# Patient Record
Sex: Female | Born: 1970 | Race: White | Hispanic: No | Marital: Married | Smoking: Never smoker
Health system: Southern US, Community
[De-identification: ages and names within clinical notes are randomized; demographics above are authoritative.]

## PROBLEM LIST (undated history)

## (undated) DIAGNOSIS — E785 Hyperlipidemia, unspecified: Secondary | ICD-10-CM

## (undated) DIAGNOSIS — F419 Anxiety disorder, unspecified: Secondary | ICD-10-CM

## (undated) DIAGNOSIS — K589 Irritable bowel syndrome without diarrhea: Secondary | ICD-10-CM

## (undated) DIAGNOSIS — E039 Hypothyroidism, unspecified: Secondary | ICD-10-CM

## (undated) DIAGNOSIS — R51 Headache: Secondary | ICD-10-CM

## (undated) DIAGNOSIS — D229 Melanocytic nevi, unspecified: Secondary | ICD-10-CM

## (undated) DIAGNOSIS — G8929 Other chronic pain: Secondary | ICD-10-CM

## (undated) DIAGNOSIS — K52839 Microscopic colitis, unspecified: Secondary | ICD-10-CM

## (undated) DIAGNOSIS — M797 Fibromyalgia: Secondary | ICD-10-CM

## (undated) HISTORY — DX: Headache: R51

## (undated) HISTORY — PX: NO PAST SURGERIES: SHX2092

## (undated) HISTORY — DX: Hyperlipidemia, unspecified: E78.5

## (undated) HISTORY — DX: Fibromyalgia: M79.7

## (undated) HISTORY — DX: Irritable bowel syndrome, unspecified: K58.9

## (undated) HISTORY — DX: Melanocytic nevi, unspecified: D22.9

## (undated) HISTORY — PX: LASER ABLATION OF THE CERVIX: SHX1949

## (undated) HISTORY — DX: Other chronic pain: G89.29

## (undated) HISTORY — DX: Microscopic colitis, unspecified: K52.839

## (undated) HISTORY — DX: Anxiety disorder, unspecified: F41.9

## (undated) HISTORY — DX: Hypothyroidism, unspecified: E03.9

---

## 2010-02-17 DIAGNOSIS — D229 Melanocytic nevi, unspecified: Secondary | ICD-10-CM

## 2010-02-17 HISTORY — DX: Melanocytic nevi, unspecified: D22.9

## 2013-06-07 DIAGNOSIS — D229 Melanocytic nevi, unspecified: Secondary | ICD-10-CM

## 2013-06-07 HISTORY — DX: Melanocytic nevi, unspecified: D22.9

## 2016-06-08 ENCOUNTER — Encounter: Payer: Self-pay | Admitting: Gastroenterology

## 2016-07-13 ENCOUNTER — Encounter: Payer: Self-pay | Admitting: Gastroenterology

## 2016-07-13 ENCOUNTER — Ambulatory Visit (INDEPENDENT_AMBULATORY_CARE_PROVIDER_SITE_OTHER): Payer: BLUE CROSS/BLUE SHIELD | Admitting: Gastroenterology

## 2016-07-13 VITALS — BP 104/76 | HR 64 | Ht 65.5 in | Wt 156.1 lb

## 2016-07-13 DIAGNOSIS — K649 Unspecified hemorrhoids: Secondary | ICD-10-CM | POA: Diagnosis not present

## 2016-07-13 DIAGNOSIS — R140 Abdominal distension (gaseous): Secondary | ICD-10-CM

## 2016-07-13 DIAGNOSIS — K589 Irritable bowel syndrome without diarrhea: Secondary | ICD-10-CM

## 2016-07-13 MED ORDER — DICYCLOMINE HCL 10 MG PO CAPS: 10.0000 mg | ORAL_CAPSULE | Freq: Three times a day (TID) | ORAL | 3 refills | Status: AC | PRN

## 2016-07-13 NOTE — Progress Notes (Signed)
HPI :  46 y/o female with a reported history of IBS, fibromyalgia, anxiety, here for a new patient evaluatoin for gas / bloating / hemorrhoids / constipation.   She reports ongoing symptoms of IBS for a long time, several years.   Bowel habits variable. She previously had loose stools for a period of time, and then may not have a bowel movement for up to a week. Constipation in the past month has been much more prominent, not having any diarrhea recently. She has a lot of straining to relieve herself. Hemorrhoids present, no blood in the stool, only when wiping. Hemorrhoids are paiful, she thinks prolapse and she thinks she reduce without intervention. She has been straining more often recently.  She has some discomfort and bloating throughout her entire abdomen, often in her lower abdomen and sometimes in hier mid / left abdomen with spasm. She has some relief of this with a bowel movements.  No nausea or vomiting. Eating okay. She has a lot of gas production and inability to control her flatulence at times, hard time holding it in. No weight loss.   Great aunt with colon cancer, no first degree relatives with CRC. She has had a prior colonoscopy, maybe around 2014, she thinks normal but mentions she may have had "microscopic colitis". Denies any specific treatment for this.   She takes ibuprofen, she takes it twice a day for fibromyalgia.   Not taking anything else for her bowels at present. She thinks she has had testing for celiac disease before and negative.     Past Medical History:  Diagnosis Date  . Anxiety   . Chronic headaches   . Fibromyalgia   . HLD (hyperlipidemia)   . Hypothyroidism   . IBS (irritable bowel syndrome)      Past Surgical History:  Procedure Laterality Date  . NO PAST SURGERIES     Family History  Problem Relation Age of Onset  . Colon polyps Mother   . Irritable bowel syndrome Mother   . Lung cancer Father        smoker  . Colon polyps Maternal  Grandmother    Social History  Substance Use Topics  . Smoking status: Never Smoker  . Smokeless tobacco: Never Used  . Alcohol use Yes     Comment: social   Current Outpatient Prescriptions  Medication Sig Dispense Refill  . escitalopram (LEXAPRO) 20 MG tablet Take 20 mg by mouth daily.  3  . levothyroxine (SYNTHROID, LEVOTHROID) 100 MCG tablet Take 100 mcg by mouth daily.  3  . LORazepam (ATIVAN) 1 MG tablet Take 1 mg by mouth 4 (four) times daily as needed.  5  . LYRICA 300 MG capsule Take 300 mg by mouth 2 (two) times daily.  5   No current facility-administered medications for this visit.    Allergies  Allergen Reactions  . Codeine Nausea And Vomiting     Review of Systems: All systems reviewed and negative except where noted in HPI.   No results found for: WBC, HGB, HCT, MCV, PLT  No results found for: CREATININE, BUN, NA, K, CL, CO2  No results found for: ALT, AST, GGT, ALKPHOS, BILITOT   Physical Exam: BP 104/76 (BP Location: Left Arm, Patient Position: Sitting, Cuff Size: Normal)   Pulse 64   Ht 5' 5.5" (1.664 m) Comment: height measured without shoes  Wt 156 lb 2 oz (70.8 kg)   LMP 06/30/2016   BMI 25.59 kg/m  Constitutional: Pleasant,well-developed, female  in no acute distress. HEENT: Normocephalic and atraumatic. Conjunctivae are normal. No scleral icterus. Neck supple.  Cardiovascular: Normal rate, regular rhythm.  Pulmonary/chest: Effort normal and breath sounds normal. No wheezing, rales or rhonchi. Abdominal: Soft, nondistended, mild diffuse discomfort with mild palpation, no focal findings.. There are no masses palpable. No hepatomegaly. DRE / Anoscopy - no mass lesion, normal resting tone, decreased squeeze tone / decent, small internal hemorrhoids Extremities: no edema Lymphadenopathy: No cervical adenopathy noted. Neurological: Alert and oriented to person place and time. Skin: Skin is warm and dry. No rashes noted. Psychiatric: Normal mood and  affect. Behavior is normal.   ASSESSMENT AND PLAN: 46 year old female with history as outlined above, constellation of symptoms are most consistent with IBS. I've asked to get records from her prior colonoscopy to ensure normal, she reports findings of "microscopic colitis", although denies any specific treatment for this, and uses NSAIDs routinely which can confound this presentation. Given her severe constipation of present doubt she has microscopic colitis. Otherwise we'll obtain prior labs that she has done, specifically make sure no evidence of celiac disease. Of note she has some decreased squeeze pressure anal sphincter on exam today in light of her symptoms of inability to control gas.  This time recommend following: - trial of low FODMAP diet - trial of Bentyl 10mg  q 8 hrs prn  - trial of miralax 17gm BID, titrate as needed for constipation - stop ibuprofen and all NSAIDs, use tylenol PRN - get results from prior colonoscopy, and celiac testing / basic labs - asked to call back in 1 month if no better - will consider pelvic floor PT if symptoms persist despite management of bloating / gas  - may consider trial of VSL#3 or rifaximin pending course - treat constipation to treat hemorrhoids, if symptoms continue to bother her can consider banding  All questions answered, she agreed with the plan.   [place] Cellar, MD [facility] Pager [phone]

## 2016-07-13 NOTE — Patient Instructions (Signed)
If you are age 46 or older, your body mass index should be between 23-30. Your Body mass index is 25.59 kg/m. If this is out of the aforementioned range listed, please consider follow up with your Primary Care Provider.  If you are age 97 or younger, your body mass index should be between 19-25. Your Body mass index is 25.59 kg/m. If this is out of the aformentioned range listed, please consider follow up with your Primary Care Provider.   We have sent the following medications to your pharmacy for you to pick up at your convenience:  Bentyl  Please use Miralax twice daily.  Please discontinue Ibuprofen and use Tylenol instead.  You have been given a Low FodMap Diet to follow.  Please call back in 1 month if you have not improved.  Thank you.

## 2016-07-16 ENCOUNTER — Telehealth: Payer: Self-pay | Admitting: Gastroenterology

## 2016-07-16 NOTE — Telephone Encounter (Signed)
Received records of prior workup:  Colonoscopy 03/29/2009 - normal colon but biopsies were positive for microscopic colitis  Celiac serologies obtained on 03/11/2009 were NEGATIVE including normal IgA level She also had negative genetic testing for celiac on 03/11/2009  Will await her course following recommendations during recent clinic visit. I asked her to call me in a month at that time for reassessment.

## 2017-07-12 ENCOUNTER — Telehealth: Payer: Self-pay | Admitting: Gastroenterology

## 2017-07-12 ENCOUNTER — Other Ambulatory Visit: Payer: Self-pay

## 2017-07-12 DIAGNOSIS — R197 Diarrhea, unspecified: Secondary | ICD-10-CM

## 2017-07-12 NOTE — Telephone Encounter (Signed)
Patient states she has been having diarrhea for the past two weeks and wants advice on what to do.

## 2017-07-12 NOTE — Telephone Encounter (Signed)
The last time I saw her was about a year ago, she complained of constipation at that time and was given Miralax. If she is still taking Miralax she should stop it. She does have a history of microscopic colitis on remote biopsies, this is possible but if she has had an acute diarrhea for 2 weeks recommend a GI pathogen panel to ensure we rule out infectious etiologies. She should stop any Miralax she is taking and can use some immodium PRN in the interim. I will let her know what to do once stool studies return if symptoms persist despite immodium. thanks

## 2017-07-12 NOTE — Telephone Encounter (Signed)
Spoke to patient, she has had diarrhea for last several weeks, associated right after eating. She has bloating and some abdominal cramping but denies any severe abdominal pain, blood in stool or fever. She has not started any new medication or been on antibiotics. Last seen 07/13/16, midweek there are available APP appointments.

## 2017-07-12 NOTE — Telephone Encounter (Signed)
Patient advised to come do GI pathogen study, imodium prn and will let her know results as soon as they are back. She did stop taking the miralax on her own.

## 2017-07-13 ENCOUNTER — Other Ambulatory Visit: Payer: BLUE CROSS/BLUE SHIELD

## 2017-08-03 ENCOUNTER — Telehealth: Payer: Self-pay | Admitting: Gastroenterology

## 2017-08-03 NOTE — Telephone Encounter (Signed)
Spoke to pt. She has tried the imodium and it does help to slow it down for a couple of days but then back to normal. She understands -  Thought it was flagyl she had in the past for her condition. Sorry if that is wrong. She will work on getting the GI Path panel sample collected and submitted and will touch base with [place] in a couple of days. She thanks you for your help.

## 2017-08-03 NOTE — Telephone Encounter (Signed)
Has she tried taking immodium yet? I'm not sure what we are treating if we give her flagyl empirically, as she has a history remotely of microscopic colitis, which would not be treated with flagyl. I would prefer if she submitted the stool test first and use immodium to help slow her down. If immodium is not helping let me know and we can consider other therapy for microscopic colitis if she thinks that is what's driving this. Thanks

## 2017-08-03 NOTE — Telephone Encounter (Signed)
Called and spoke to pt. She is having a hard time getting the timing right with working and her bouts of diarrhea to collect a sample for GI pathogen panel.  She feels her symptoms are consistent with colitis flares she has had in the past: constant diarrhea, gurgling, bubbling and lots of mucus in her diarrhea.  She will certainly keep trying to collect a sample but wonders if you would consider giving her a Rx for Flagyl. Please advise

## 2017-08-09 ENCOUNTER — Other Ambulatory Visit: Payer: Self-pay

## 2017-08-09 DIAGNOSIS — R197 Diarrhea, unspecified: Secondary | ICD-10-CM

## 2017-08-16 LAB — GASTROINTESTINAL PATHOGEN PANEL PCR
C. DIFFICILE TOX A/B, PCR: NOT DETECTED
CAMPYLOBACTER, PCR: NOT DETECTED
Cryptosporidium, PCR: NOT DETECTED
E COLI 0157, PCR: NOT DETECTED
E coli (ETEC) LT/ST PCR: NOT DETECTED
E coli (STEC) stx1/stx2, PCR: NOT DETECTED
GIARDIA LAMBLIA, PCR: NOT DETECTED
Norovirus, PCR: NOT DETECTED
Rotavirus A, PCR: NOT DETECTED
Salmonella, PCR: NOT DETECTED
Shigella, PCR: NOT DETECTED

## 2017-08-23 ENCOUNTER — Telehealth: Payer: Self-pay | Admitting: Gastroenterology

## 2017-08-23 NOTE — Telephone Encounter (Signed)
Routed to DOD, Dr. [name] patient. Patient has been taking the imodium, it has not helped with the diarrhea. She has GI path done on 08/09/17. Please advise.

## 2017-08-23 NOTE — Telephone Encounter (Signed)
I reviewed his office note from earlier this year, it looks like she may have been diagnosed with microscopic colitis previously.  Imodium is not helping.  GI pathogen panel was negative.  Can you please call her in a prescription for budesonide 3 mg pills that she should take 3 pills once daily.  Dispense 90 with 2 refills.  She should not taper at all for now.  I would like her to call back in 1 to 2 weeks to report on her response.

## 2017-08-24 ENCOUNTER — Other Ambulatory Visit: Payer: Self-pay

## 2017-08-24 MED ORDER — BUDESONIDE 3 MG PO CPEP: 9.0000 mg | ORAL_CAPSULE | Freq: Every day | ORAL | 2 refills | Status: DC

## 2017-08-24 NOTE — Telephone Encounter (Signed)
Spoke to patient advised of Rx to be prescribed, she understands to call back to give an update in 1-2 weeks. At that time, Dr. [name] can decide on taper instructions.

## 2017-10-05 ENCOUNTER — Telehealth: Payer: Self-pay | Admitting: Gastroenterology

## 2017-10-05 NOTE — Telephone Encounter (Signed)
Patient advised of recommendations to taper budesonide. I have scheduled her a follow up appointment on 10/2, first available. She is taking the bentyl prn, instructed to take daily if needed. Patient understands to increase budesonide back to 9 mg if diarrhea worsens.

## 2017-10-05 NOTE — Telephone Encounter (Signed)
Spoke to patient, she reports that symptoms of explosive diarrhea are better. She now only has intermittent diarrhea. She still has LUQ abdominal pain, bloating, gas most of the time. She is still taking budesonide 9 mg daily.

## 2017-10-05 NOTE — Telephone Encounter (Signed)
Thanks for the update Jo Summers. She has a history of microscopic colitis, given trial of budesonide. If she has been on 9mg  for a month already, why don't we reduce dose to 6mg  / day for 2 weeks, then 3mg  / day for 2 weeks, then stop and see how she does. I would like her to have follow up with [place] in the next 1-2 months for reassessment. Is she taking her bentyl? She should continue that. Also try low FODMAP diet if she hasn't done that yet. We can discuss other options at clinic follow up. If she has worsening diarrhea with tapering budesonide tell her to go back to 9mg / day until she sees [place]. Thanks

## 2017-12-01 ENCOUNTER — Encounter

## 2017-12-01 ENCOUNTER — Encounter: Payer: Self-pay | Admitting: Gastroenterology

## 2017-12-01 ENCOUNTER — Ambulatory Visit: Payer: BLUE CROSS/BLUE SHIELD | Admitting: Gastroenterology

## 2017-12-01 VITALS — BP 100/62 | HR 58 | Ht 65.5 in | Wt 152.0 lb

## 2017-12-01 DIAGNOSIS — R109 Unspecified abdominal pain: Secondary | ICD-10-CM | POA: Diagnosis not present

## 2017-12-01 DIAGNOSIS — K52839 Microscopic colitis, unspecified: Secondary | ICD-10-CM | POA: Diagnosis not present

## 2017-12-01 DIAGNOSIS — K589 Irritable bowel syndrome without diarrhea: Secondary | ICD-10-CM

## 2017-12-01 MED ORDER — AMBULATORY NON FORMULARY MEDICATION: 0 refills | Status: AC

## 2017-12-01 NOTE — Progress Notes (Signed)
HPI :  47 year old female here for follow-up visit. At the time of our initial consultation in May 2018 I had recommended a trial of low FODMAP diet and Bentyl. We obtain the results from her last colonoscopy in 2011 as well as lab testing as outlined below. She was found to have microscopic colitis at that time.  We await her course and she was stable for a while however early this year developed recurrent severe diarrhea. She had a negative infectious evaluation and was given budesonide empirically to treat her previously diagnosed microscopic colitis. She states the budesonide definitely helps treat her diarrhea and she felt better.  Today her main complaint is pain in her abdomen. She has pain in her mid to lower abdomen, states it feels like a soreness that been there for a long time. She states this feels different from her typical irritable bowel syndrome, particularly with pain in the mid left side, worse in recent months. Pain is there every day, can fluctuate, but feels it mostly all the time. She does not feel that having a bowel movement improves her pain at all at this point. Since the budesonide was completed, she completed 6 weeks of this, last dose in August, she reports her bowels are fairly normal in form, although she does have some periods of time where she goes more frequently. Prior to budesonide she had high-frequency loose stools. She denies any NSAIDs. She has used the Bentyl periodically which can help somewhat. She has been trying to use high-fiber diet as well.  Colonoscopy 03/29/2009 - normal colon but biopsies were positive for microscopic colitis  Of note, celiac serologies obtained on 03/11/2009 were NEGATIVE including normal IgA level. She also had negative genetic testing for celiac on 03/11/2009.  Great aunt with colon cancer, no first degree relatives with CRC.  Past Medical History:  Diagnosis Date  . Anxiety   . Chronic headaches   . Fibromyalgia   . HLD  (hyperlipidemia)   . Hypothyroidism   . IBS (irritable bowel syndrome)   . Microscopic colitis      Past Surgical History:  Procedure Laterality Date  . NO PAST SURGERIES     Family History  Problem Relation Age of Onset  . Colon polyps Mother   . Irritable bowel syndrome Mother   . Lung cancer Father        smoker  . Colon polyps Maternal Grandmother   . Colon cancer Other        great aunt  . Stomach cancer Neg Hx   . Pancreatic cancer Neg Hx   . AAA (abdominal aortic aneurysm) Neg Hx    Social History   Tobacco Use  . Smoking status: Never Smoker  . Smokeless tobacco: Never Used  Substance Use Topics  . Alcohol use: Yes    Comment: social  . Drug use: No   Current Outpatient Medications  Medication Sig Dispense Refill  . dicyclomine (BENTYL) 10 MG capsule Take 1 capsule (10 mg total) by mouth every 8 (eight) hours as needed for spasms. 60 capsule 3  . escitalopram (LEXAPRO) 20 MG tablet Take 20 mg by mouth daily.  3  . levothyroxine (SYNTHROID, LEVOTHROID) 100 MCG tablet Take 100 mcg by mouth daily.  3  . LORazepam (ATIVAN) 1 MG tablet Take 1 mg by mouth 4 (four) times daily as needed.  5  . LYRICA 300 MG capsule Take 300 mg by mouth 2 (two) times daily.  5  . AMBULATORY  NON FORMULARY MEDICATION Medication Name: IBgard: Take as directed 12 capsule 0   No current facility-administered medications for this visit.    Allergies  Allergen Reactions  . Codeine Nausea And Vomiting     Review of Systems: All systems reviewed and negative except where noted in HPI.     Physical Exam: BP 100/62   Pulse (!) 58   Ht 5' 5.5" (1.664 m)   Wt 152 lb (68.9 kg)   BMI 24.91 kg/m  Constitutional: Pleasant,well-developed, female in no acute distress. HEENT: Normocephalic and atraumatic. Conjunctivae are normal. No scleral icterus. Neck supple.  Cardiovascular: Normal rate, regular rhythm.  Pulmonary/chest: Effort normal and breath sounds normal. No wheezing, rales or  rhonchi. Abdominal: Soft, nondistended, mild diffuse TTP, worst in left mid abdomen. There are no masses palpable. No hepatomegaly. Extremities: no edema Lymphadenopathy: No cervical adenopathy noted. Neurological: Alert and oriented to person place and time. Skin: Skin is warm and dry. No rashes noted. Psychiatric: Normal mood and affect. Behavior is normal.   ASSESSMENT AND PLAN: 48 year old female here for reassessment of the following issues:  Abdominal pain / microscopic colitis / IBS - she has a remote history of microscopic colitis and clearly responded to budesonide a few months ago, her diarrhea has now resolved. She continues to have some bloating intermittently and I do think she has some concomitant IBS, however her main complaint is abdominal pain at this time which has been bothering her significantly in recent months. Further her pain seems to be unrelated to bowel habits at this point, and she does have some tenderness in the left mid abdomen. She's never had cross-sectional imaging, I'm recommending a CT scan of the abdomen and pelvis to further evaluate her pain. I will give her a trial of IB guard in the interim to treat component of IBS and see if that helps. If her diarrhea recurs would have low threshold to use budesonide, but I counseled her I do not think her microscopic colitis is causing her abdominal pain as she describes it today. We'll await results of imaging and her course with the IB guard. She agreed.   [place] Cellar, MD [facility]

## 2017-12-01 NOTE — Patient Instructions (Signed)
If you are age 47 or older, your body mass index should be between 23-30. Your Body mass index is 24.91 kg/m. If this is out of the aforementioned range listed, please consider follow up with your Primary Care Provider.  If you are age 91 or younger, your body mass index should be between 19-25. Your Body mass index is 24.91 kg/m. If this is out of the aformentioned range listed, please consider follow up with your Primary Care Provider.   We have given you samples of the following medication to take: IBgard: take are directed.  If these are helpful you can purchase them over the counter.   You have been scheduled for a CT scan of the abdomen and pelvis at [facility] ([address]---this is in the same building as Press [facility]).   You are scheduled on Friday, October 11th at 11:00am. You should arrive 15 minutes prior to your appointment time for registration. Please follow the written instructions below on the day of your exam:  WARNING: IF YOU ARE ALLERGIC TO IODINE/X-RAY DYE, PLEASE NOTIFY RADIOLOGY IMMEDIATELY AT [phone]! YOU WILL BE GIVEN A 13 HOUR PREMEDICATION PREP.  1) Do not eat  anything after 7:00am (4 hours prior to your test) 2) You have been given 2 bottles of oral contrast to drink. The solution may taste better if refrigerated, but do NOT add ice or any other liquid to this solution. Shake well before drinking.    Drink 1 bottle of contrast @ 9:00am (2 hours prior to your exam)  Drink 1 bottle of contrast @ 10:00am (1 hour prior to your exam)  You may take any medications as prescribed with a small amount of water, if necessary. If you take any of the following medications: METFORMIN, GLUCOPHAGE, GLUCOVANCE, AVANDAMET, RIOMET, FORTAMET, [place] MET, JANUMET, GLUMETZA or METAGLIP, you MAY be asked to HOLD this medication 48 hours AFTER the exam.  The purpose of you drinking the oral contrast is to aid in the visualization of your intestinal tract.  The contrast solution may cause some diarrhea. Depending on your individual set of symptoms, you may also receive an intravenous injection of x-ray contrast/dye. Plan on being at [facility] for 30 minutes or longer, depending on the type of exam you are having performed.  This test typically takes 30-45 minutes to complete.  If you have any questions regarding your exam or if you need to reschedule, you may call the CT department at [phone] between the hours of 8:00 am and 5:00 pm, Monday-Friday.  ________________________________________________________________  Thank you for entrusting me with your care and for choosing [facility], Dr. [place] Cellar

## 2017-12-10 ENCOUNTER — Ambulatory Visit (INDEPENDENT_AMBULATORY_CARE_PROVIDER_SITE_OTHER)
Admission: RE | Admit: 2017-12-10 | Discharge: 2017-12-10 | Disposition: A | Discharge status: Home / Self Care | Payer: BLUE CROSS/BLUE SHIELD | Source: Ambulatory Visit | Attending: Gastroenterology | Admitting: Gastroenterology

## 2017-12-10 DIAGNOSIS — R109 Unspecified abdominal pain: Secondary | ICD-10-CM

## 2017-12-10 MED ORDER — IOPAMIDOL (ISOVUE-300) INJECTION 61%
100.0000 mL | Freq: Once | INTRAVENOUS | Status: AC | PRN
  Administered 2017-12-10: 100 mL via INTRAVENOUS

## 2019-05-26 IMAGING — CT CT ABD-PELV W/ CM
2 of 5 series · 16 of 46 positions shown, 18 images · IV contrast (ISOVUE 300)
Comparison: None.

CLINICAL DATA: Abdominal pain, bloating, and diarrhea for 6-12
months.

EXAM:
CT ABDOMEN AND PELVIS WITH CONTRAST
TECHNIQUE: Multidetector CT imaging of the abdomen and pelvis was performed
using the standard protocol following bolus administration of
intravenous contrast.
CONTRAST:  100mL BKH1QQ-CFF IOPAMIDOL (BKH1QQ-CFF) INJECTION 61%

[Series 2: abd/pel w · axial · 0.68mm/px · z∈[-409,+16]mm · 13 of 96 slices shown, 15 images]
[im 6/96  soft-tissue]
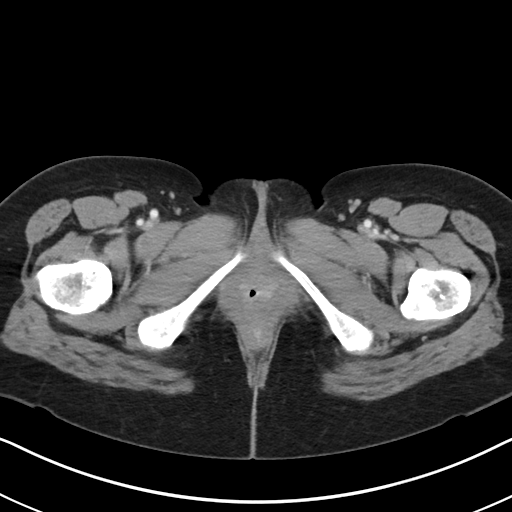
[im 6/96  bone]
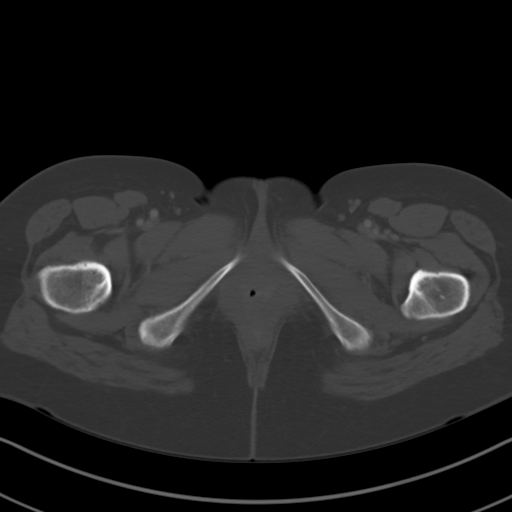
[im 16/96  soft-tissue]
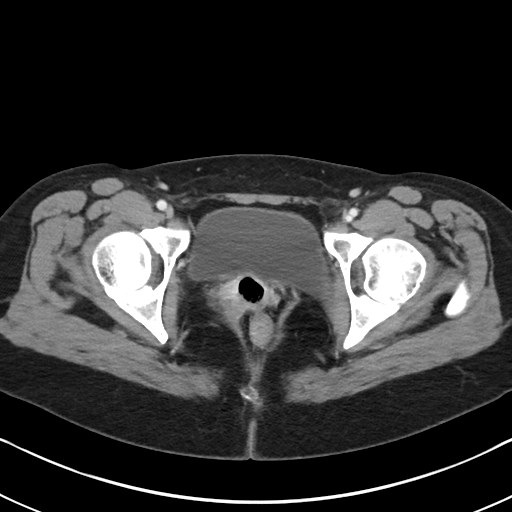
[im 21/96  soft-tissue]
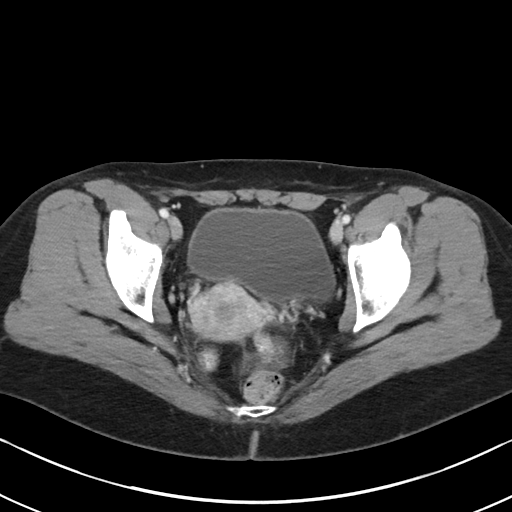
[im 26/96  soft-tissue]
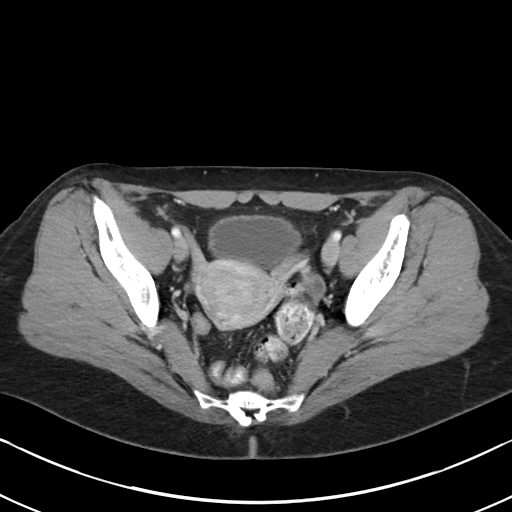
[im 36/96  soft-tissue]
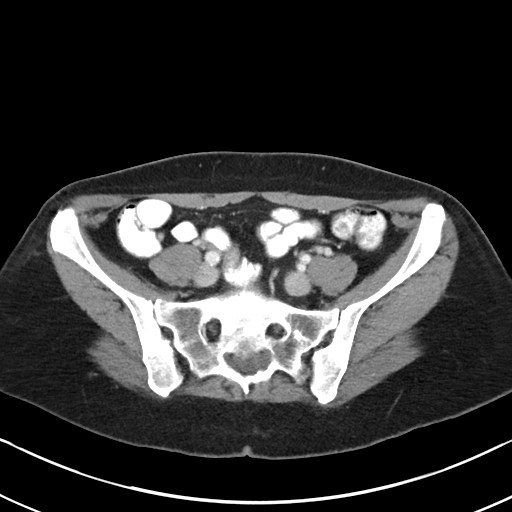
[im 41/96  soft-tissue]
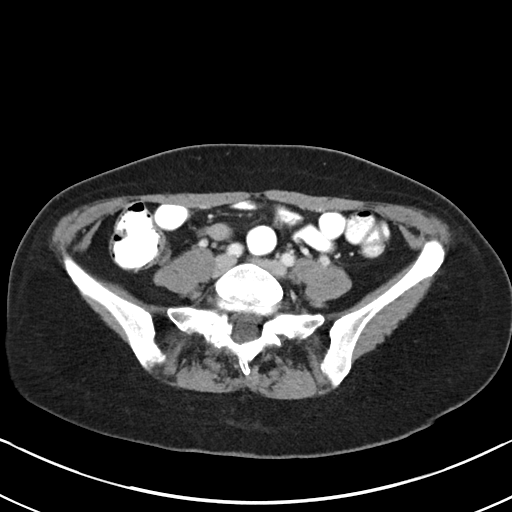
[im 51/96  soft-tissue]
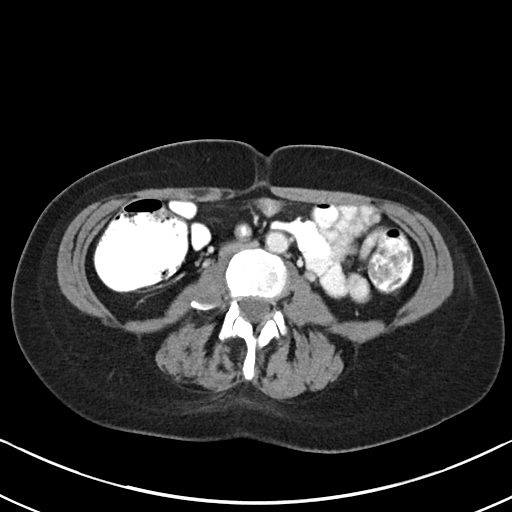
[im 56/96  soft-tissue]
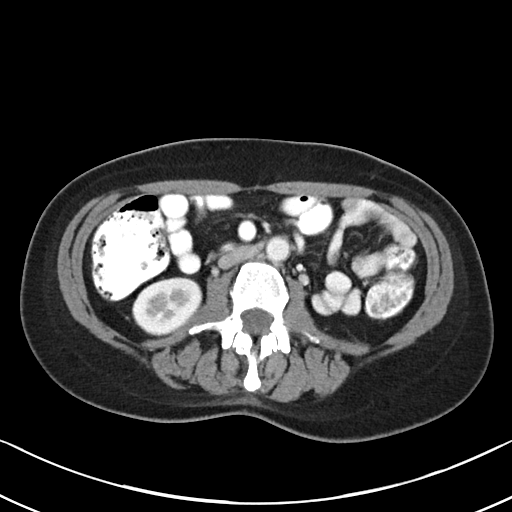
[im 61/96  soft-tissue]
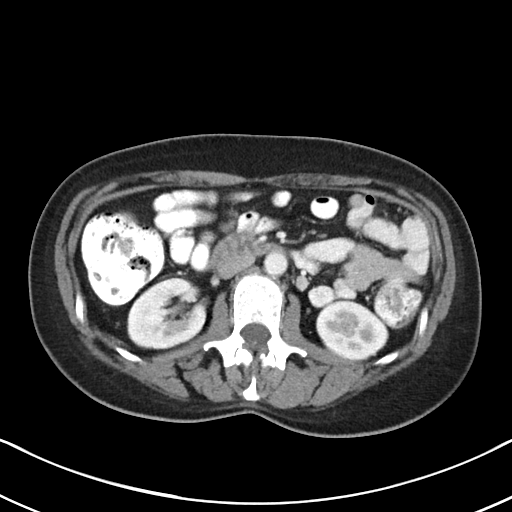
[im 61/96  bone]
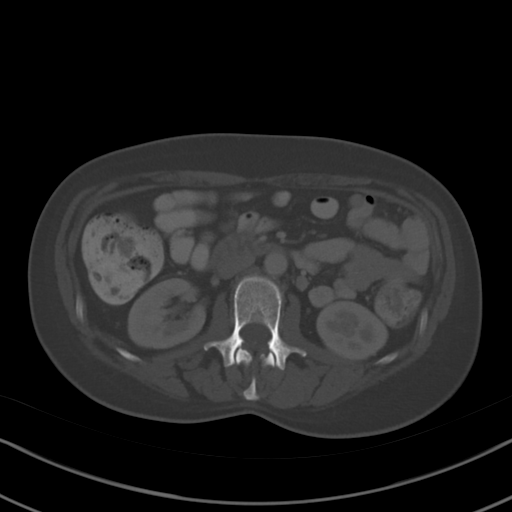
[im 71/96  soft-tissue]
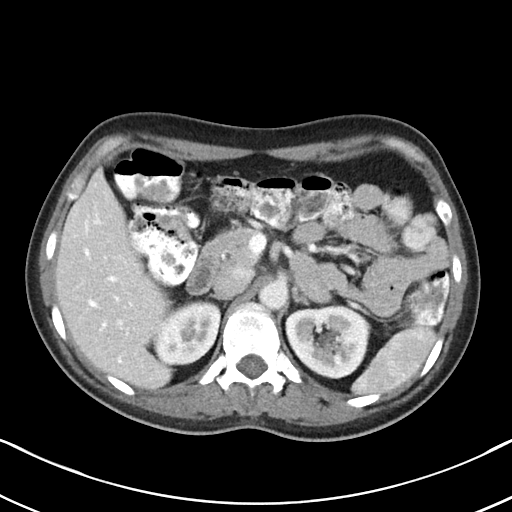
[im 76/96  soft-tissue]
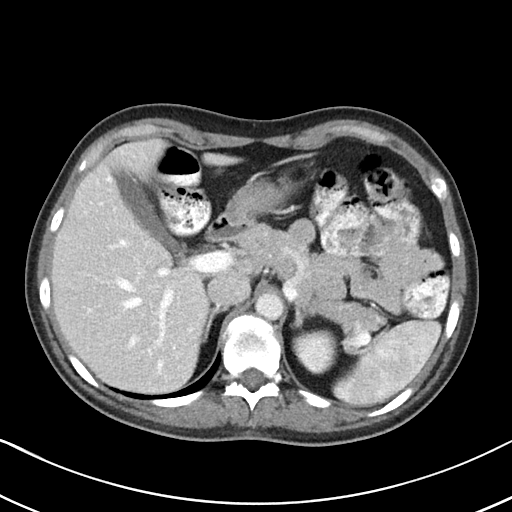
[im 81/96  soft-tissue]
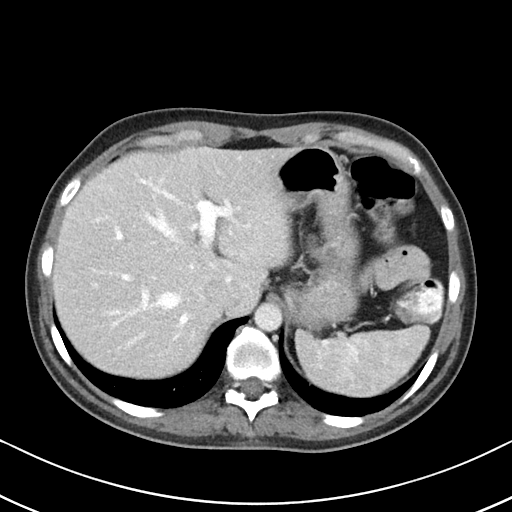
[im 91/96  soft-tissue]
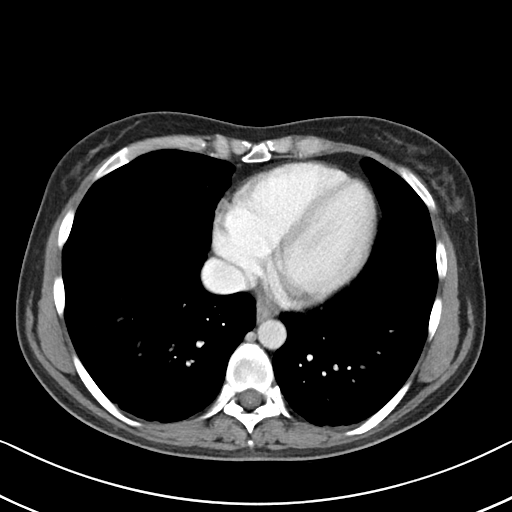

[Series 6: abd/pel w st · coronal · 0.57mm/px · 3 of 72 slices shown]
[im 24/72  soft-tissue]
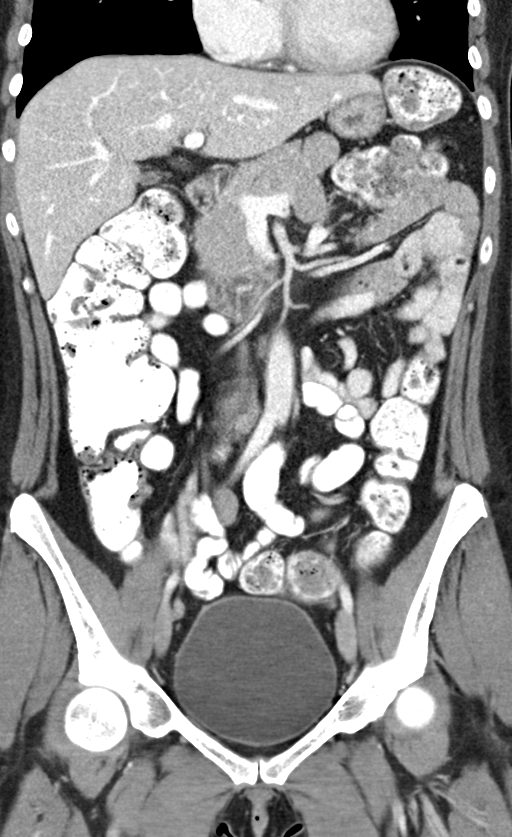
[im 32/72  soft-tissue]
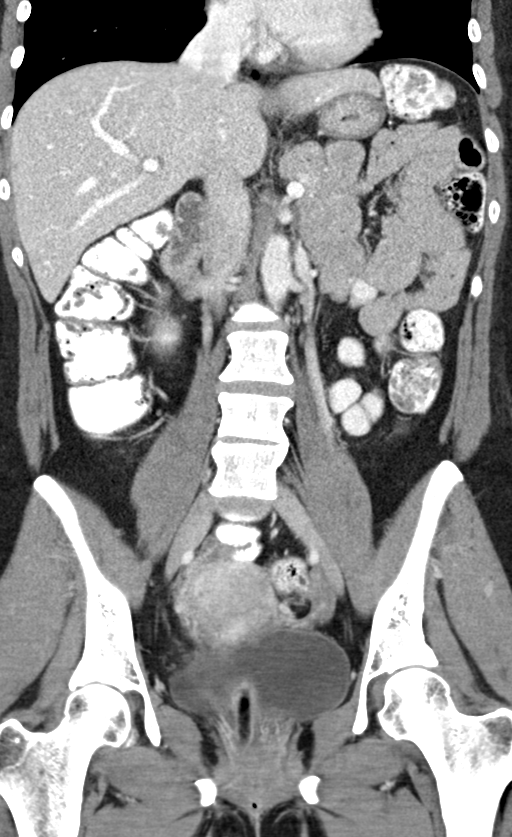
[im 40/72  soft-tissue]
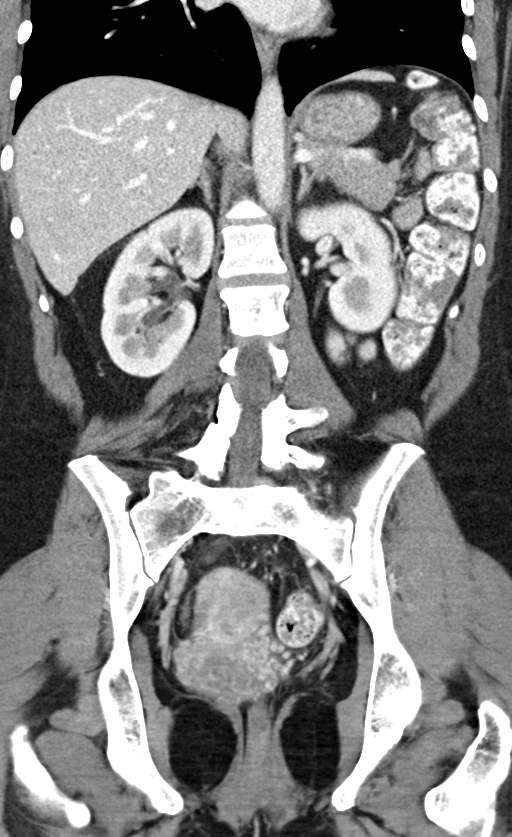

[16 of 46 positions shown; findings below may reference images not displayed]

FINDINGS: Lower Chest: No acute findings.

Hepatobiliary: No hepatic masses identified. Gallbladder is
unremarkable.

Pancreas:  No mass or inflammatory changes.

Spleen: Within normal limits in size and appearance.

Adrenals/Urinary Tract: No masses identified. No evidence of
hydronephrosis.

Stomach/Bowel: No evidence of obstruction, inflammatory process or
abnormal fluid collections.

Vascular/Lymphatic: No pathologically enlarged lymph nodes. No
abdominal aortic aneurysm.

Reproductive:  No mass or other significant abnormality.

Other:  None.

Musculoskeletal:  No suspicious bone lesions identified.
IMPRESSION: Negative.  No acute findings or other significant abnormality.

## 2021-03-13 ENCOUNTER — Encounter: Payer: Self-pay | Admitting: Gastroenterology

## 2021-04-17 ENCOUNTER — Ambulatory Visit (AMBULATORY_SURGERY_CENTER): Payer: BLUE CROSS/BLUE SHIELD | Admitting: *Deleted

## 2021-04-17 ENCOUNTER — Other Ambulatory Visit: Payer: Self-pay

## 2021-04-17 VITALS — Ht 66.0 in | Wt 161.0 lb

## 2021-04-17 DIAGNOSIS — Z1211 Encounter for screening for malignant neoplasm of colon: Secondary | ICD-10-CM

## 2021-04-17 MED ORDER — NA SULFATE-K SULFATE-MG SULF 17.5-3.13-1.6 GM/177ML PO SOLN: 2.0000 | Freq: Once | ORAL | 0 refills | Status: AC

## 2021-04-17 NOTE — Progress Notes (Signed)
No egg or soy allergy known to patient  No issues known to pt with past sedation with any surgeries or procedures Patient denies ever being told they had issues or difficulty with intubation  No FH of Malignant Hyperthermia Pt is not on diet pills Pt is not on  home 02  Pt is not on blood thinners  Pt states intermittent issues with constipation, encouraged to take Miralax daily prior to exam. No A fib or A flutter     Due to the COVID-19 pandemic we are asking patients to follow certain guidelines in PV and the [place]   Pt aware of COVID protocols and [facility] guidelines   PV completed over the phone. Pt verified name, DOB, address and insurance during PV today.  Pt mailed instruction packet with copy of consent form to read and not return, and instructions.  Pt encouraged to call with questions or issues.  If pt has My chart, procedure instructions sent via My Chart

## 2021-04-28 ENCOUNTER — Encounter: Payer: Self-pay | Admitting: Gastroenterology

## 2021-05-01 ENCOUNTER — Ambulatory Visit (AMBULATORY_SURGERY_CENTER): Payer: BC Managed Care – PPO | Admitting: Gastroenterology

## 2021-05-01 ENCOUNTER — Encounter: Payer: Self-pay | Admitting: Gastroenterology

## 2021-05-01 VITALS — BP 125/71 | HR 96 | Temp 97.5°F | Resp 13 | Ht 65.0 in | Wt 161.0 lb

## 2021-05-01 DIAGNOSIS — R197 Diarrhea, unspecified: Secondary | ICD-10-CM

## 2021-05-01 DIAGNOSIS — D122 Benign neoplasm of ascending colon: Secondary | ICD-10-CM

## 2021-05-01 DIAGNOSIS — Z1211 Encounter for screening for malignant neoplasm of colon: Secondary | ICD-10-CM

## 2021-05-01 DIAGNOSIS — K635 Polyp of colon: Secondary | ICD-10-CM | POA: Diagnosis not present

## 2021-05-01 DIAGNOSIS — D123 Benign neoplasm of transverse colon: Secondary | ICD-10-CM | POA: Diagnosis not present

## 2021-05-01 MED ORDER — SODIUM CHLORIDE 0.9 % IV SOLN: 500.0000 mL | Freq: Once | INTRAVENOUS | Status: DC

## 2021-05-01 NOTE — Op Note (Signed)
[place] ?Patient Name: Jo Summers ?Procedure Date: 05/01/2021 1:58 PM ?MRN: [id number] ?Endoscopist: [name]. [name] , MD ?Age: 51 ?Referring MD:  ?Date of Birth: 1970/07/18 ?Gender: Female ?Account #: [id number] ?Procedure:                Colonoscopy ?Indications:              Screening for colorectal malignant neoplasm ?Medicines:                Monitored Anesthesia Care ?Procedure:                Pre-Anesthesia Assessment: ?                          - Prior to the procedure, a History and Physical  ?                          was performed, and patient medications and  ?                          allergies were reviewed. The patient's tolerance of  ?                          previous anesthesia was also reviewed. The risks  ?                          and benefits of the procedure and the sedation  ?                          options and risks were discussed with the patient.  ?                          All questions were answered, and informed consent  ?                          was obtained. Prior Anticoagulants: The patient has  ?                          taken no previous anticoagulant or antiplatelet  ?                          agents. ASA Grade Assessment: II - A patient with  ?                          mild systemic disease. After reviewing the risks  ?                          and benefits, the patient was deemed in  ?                          satisfactory condition to undergo the procedure. ?                          After obtaining informed consent, the colonoscope  ?  was passed under direct vision. Throughout the  ?                          procedure, the patient's blood pressure, pulse, and  ?                          oxygen saturations were monitored continuously. The  ?                          PCF-HQ190L Colonoscope was introduced through the  ?                          anus and advanced to the the terminal ileum, with  ?                           identification of the appendiceal orifice and IC  ?                          valve. The colonoscopy was performed without  ?                          difficulty. The patient tolerated the procedure  ?                          well. The quality of the bowel preparation was  ?                          adequate. The terminal ileum, ileocecal valve,  ?                          appendiceal orifice, and rectum were photographed. ?Scope In: 2:03:42 PM ?Scope Out: 2:23:11 PM ?Scope Withdrawal Time: 0 hours 16 minutes 56 seconds  ?Total Procedure Duration: 0 hours 19 minutes 29 seconds  ?Findings:                 The perianal and digital rectal examinations were  ?                          normal. ?                          The terminal ileum appeared normal. ?                          A 3 to 4 mm polyp was found in the ascending colon.  ?                          The polyp was flat. The polyp was removed with a  ?                          cold snare. Resection and retrieval were complete. ?                          A 3 mm polyp was found in the transverse  colon. The  ?                          polyp was sessile. The polyp was removed with a  ?                          cold snare. Resection and retrieval were complete. ?                          The exam was otherwise without abnormality. ?                          Biopsies for histology were taken with a cold  ?                          forceps from the right colon, left colon and  ?                          transverse colon for evaluation of microscopic  ?                          colitis given the patient has a remote history of  ?                          this and has intermittent loose stools, assess if  ?                          this is still present or not. ?Complications:            No immediate complications. Estimated blood loss:  ?                          Minimal. ?Estimated Blood Loss:     Estimated blood loss was minimal. ?Impression:               - The  examined portion of the ileum was normal. ?                          - One 3 to 4 mm polyp in the ascending colon,  ?                          removed with a cold snare. Resected and retrieved. ?                          - One 3 mm polyp in the transverse colon, removed  ?                          with a cold snare. Resected and retrieved. ?                          - The examination was otherwise normal. ?                          - Biopsies were taken with a cold  forceps from the  ?                          right colon, left colon and transverse colon for  ?                          evaluation of microscopic colitis. ?Recommendation:           - Patient has a contact number available for  ?                          emergencies. The signs and symptoms of potential  ?                          delayed complications were discussed with the  ?                          patient. Return to normal activities tomorrow.  ?                          Written discharge instructions were provided to the  ?                          patient. ?                          - Resume previous diet. ?                          - Continue present medications. ?                          - Await pathology results. ?[name]. Falon Huesca, MD ?05/01/2021 2:27:24 PM ?This report has been signed electronically. ?

## 2021-05-01 NOTE — Progress Notes (Signed)
C.W. vital signs. 

## 2021-05-01 NOTE — Progress Notes (Signed)
Pt's states no medical or surgical changes since previsit or office visit. 

## 2021-05-01 NOTE — Progress Notes (Signed)
Called to room to assist during endoscopic procedure.  Patient ID and intended procedure confirmed with present staff. Received instructions for my participation in the procedure from the performing physician.  

## 2021-05-01 NOTE — Progress Notes (Signed)
[facility] History and Physical ? ? ?Primary Care Physician:  Yvone Neu, MD ? ? ?Reason for Procedure:   Colon cancer screening ? ?Plan:    colonoscopy ? ? ? ? ?HPI: Jo Summers is a 51 y.o. female  here for colonoscopy screening. Last exam 03/2009. Patient with a remote history of microscopic colitis. Intermittent loose stools but mild.. Otherwise feels well without any cardiopulmonary symptoms.  ? ? ?Past Medical History:  ?Diagnosis Date  ? Anxiety   ? Atypical mole 02/17/2010  ? Right outer buttock  ? Chronic headaches   ? Fibromyalgia   ? HLD (hyperlipidemia)   ? Hypothyroidism   ? IBS (irritable bowel syndrome)   ? Microscopic colitis   ? Multiple atypical skin moles 06/07/2013  ? moderate left outer hip  ? Multiple atypical skin moles 07/02/2014  ? mild x2 Right upper arm, lower mid back   ? ? ?Past Surgical History:  ?Procedure Laterality Date  ? LASER ABLATION OF THE CERVIX    ? NO PAST SURGERIES    ? ? ?Prior to Admission medications   ?Medication Sig Start Date End Date Taking? Authorizing Provider  ?escitalopram (LEXAPRO) 20 MG tablet Take 20 mg by mouth daily. 04/24/16  Yes [provider]  ?levothyroxine (SYNTHROID, LEVOTHROID) 100 MCG tablet Take 100 mcg by mouth daily. 04/24/16  Yes [provider]  ?LYRICA 300 MG capsule Take 300 mg by mouth 2 (two) times daily. 07/11/16  Yes [provider]  ?AMBULATORY NON FORMULARY MEDICATION Medication Name: IBgard: Take as directed 12/01/17   Long Brimage, [name], MD  ?dicyclomine (BENTYL) 10 MG capsule Take 1 capsule (10 mg total) by mouth every 8 (eight) hours as needed for spasms. ?Patient not taking: Reported on 04/17/2021 07/13/16   Yetta Flock, MD  ?LORazepam (ATIVAN) 1 MG tablet Take 1 mg by mouth 4 (four) times daily as needed. 07/11/16   [provider]  ? ? ?Current Outpatient Medications  ?Medication Sig Dispense Refill  ? escitalopram (LEXAPRO) 20 MG tablet Take 20 mg by mouth daily.   3  ? levothyroxine (SYNTHROID, LEVOTHROID) 100 MCG tablet Take 100 mcg by mouth daily.  3  ? LYRICA 300 MG capsule Take 300 mg by mouth 2 (two) times daily.  5  ? AMBULATORY NON FORMULARY MEDICATION Medication Name: IBgard: Take as directed 12 capsule 0  ? dicyclomine (BENTYL) 10 MG capsule Take 1 capsule (10 mg total) by mouth every 8 (eight) hours as needed for spasms. (Patient not taking: Reported on 04/17/2021) 60 capsule 3  ? LORazepam (ATIVAN) 1 MG tablet Take 1 mg by mouth 4 (four) times daily as needed.  5  ? ?Current Facility-Administered Medications  ?Medication Dose Route Frequency Provider Last Rate Last Admin  ? 0.9 %  sodium chloride infusion  500 mL Intravenous Once [name], [name], MD      ? ? ?Allergies as of 05/01/2021 - Review Complete 05/01/2021  ?Allergen Reaction Noted  ? Codeine Nausea And Vomiting 07/13/2016  ? ? ?Family History  ?Problem Relation Age of Onset  ? Colon polyps Mother   ? Irritable bowel syndrome Mother   ? Lung cancer Father   ?     smoker  ? Colon polyps Maternal Grandmother   ? Colon cancer Other   ?     great aunt  ? Stomach cancer Neg Hx   ? Pancreatic cancer Neg Hx   ? AAA (abdominal aortic aneurysm) Neg Hx   ?  Esophageal cancer Neg Hx   ? Rectal cancer Neg Hx   ? ? ?Social History  ? ?Socioeconomic History  ? Marital status: Married  ?  Spouse name: Not on file  ? Number of children: 1  ? Years of education: Not on file  ? Highest education level: Not on file  ?Occupational History  ? Occupation: hair stylist  ?Tobacco Use  ? Smoking status: Never  ? Smokeless tobacco: Never  ?Vaping Use  ? Vaping Use: Never used  ?Substance and Sexual Activity  ? Alcohol use: Yes  ?  Comment: social  ? Drug use: No  ? Sexual activity: Yes  ?  Birth control/protection: None  ?Other Topics Concern  ? Not on file  ?Social History Narrative  ? Not on file  ? ?Social Determinants of Health  ? ?Financial Resource Strain: Not on file  ?Food Insecurity: Not on file  ?Transportation Needs:  Not on file  ?Physical Activity: Not on file  ?Stress: Not on file  ?Social Connections: Not on file  ?Intimate Partner Violence: Not on file  ? ? ?Review of Systems: ?All other review of systems negative except as mentioned in the HPI. ? ?Physical Exam: ?Vital signs ?BP 135/74   Pulse 60   Temp (!) 97.5 ?F (36.4 ?C)   Ht 5\' 5"  (1.651 m)   Wt 161 lb (73 kg)   SpO2 99%   BMI 26.79 kg/m?  ? ?General:   Alert,  Well-developed, pleasant and cooperative in NAD ?Lungs:  Clear throughout to auscultation.   ?Heart:  Regular rate and rhythm ?Abdomen:  Soft, nontender and nondistended.   ?Neuro/Psych:  Alert and cooperative. Normal mood and affect. A and O x 3 ? ?Jolly Mango, MD ?[facility] ? ? ?

## 2021-05-01 NOTE — Patient Instructions (Signed)
Handout on Polyps provided  ? ?Await pathology results.  ? ?Continue current medications.  ? ? ?YOU HAD AN ENDOSCOPIC PROCEDURE TODAY AT [facility]:   Refer to the procedure report that was given to you for any specific questions about what was found during the examination.  If the procedure report does not answer your questions, please call your gastroenterologist to clarify.  If you requested that your care partner not be given the details of your procedure findings, then the procedure report has been included in a sealed envelope for you to review at your convenience later. ? ?YOU SHOULD EXPECT: Some feelings of bloating in the abdomen. Passage of more gas than usual.  Walking can help get rid of the air that was put into your GI tract during the procedure and reduce the bloating. If you had a lower endoscopy (such as a colonoscopy or flexible sigmoidoscopy) you may notice spotting of blood in your stool or on the toilet paper. If you underwent a bowel prep for your procedure, you may not have a normal bowel movement for a few days. ? ?Please Note:  You might notice some irritation and congestion in your nose or some drainage.  This is from the oxygen used during your procedure.  There is no need for concern and it should clear up in a day or so. ? ?SYMPTOMS TO REPORT IMMEDIATELY: ? ?Following lower endoscopy (colonoscopy or flexible sigmoidoscopy): ? Excessive amounts of blood in the stool ? Significant tenderness or worsening of abdominal pains ? Swelling of the abdomen that is new, acute ? Fever of 100?F or higher ? ?For urgent or emergent issues, a gastroenterologist can be reached at any hour by calling [phone]. ?Do not use MyChart messaging for urgent concerns.  ? ? ?DIET:  We do recommend a small meal at first, but then you may proceed to your regular diet.  Drink plenty of fluids but you should avoid alcoholic beverages for 24 hours. ? ?ACTIVITY:  You should plan to take it easy  for the rest of today and you should NOT DRIVE or use heavy machinery until tomorrow (because of the sedation medicines used during the test).   ? ?FOLLOW UP: ?Our staff will call the number listed on your records 48-72 hours following your procedure to check on you and address any questions or concerns that you may have regarding the information given to you following your procedure. If we do not reach you, we will leave a message.  We will attempt to reach you two times.  During this call, we will ask if you have developed any symptoms of COVID 19. If you develop any symptoms (ie: fever, flu-like symptoms, shortness of breath, cough etc.) before then, please call [phone].  If you test positive for Covid 19 in the 2 weeks post procedure, please call and report this information to [place].   ? ?If any biopsies were taken you will be contacted by phone or by letter within the next 1-3 weeks.  Please call us at [phone] if you have not heard about the biopsies in 3 weeks.  ? ? ?SIGNATURES/CONFIDENTIALITY: ?You and/or your care partner have signed paperwork which will be entered into your electronic medical record.  These signatures attest to the fact that that the information above on your After Visit Summary has been reviewed and is understood.  Full responsibility of the confidentiality of this discharge information lies with you and/or your care-partner. ? ?

## 2021-05-01 NOTE — Progress Notes (Signed)
To pacu, VSS. Report to [email or web address] 

## 2021-05-05 ENCOUNTER — Telehealth: Payer: Self-pay

## 2021-05-05 NOTE — Telephone Encounter (Signed)
?  Follow up Call- ? ?Call back number 05/01/2021  ?Post procedure Call Back phone  # [phone]  ?Permission to leave phone message Yes  ?Some recent data might be hidden  ?  ? ?Patient questions: ? ?Do you have a fever, pain , or abdominal swelling? No. ?Pain Score  0 * ? ?Have you tolerated food without any problems? Yes.   ? ?Have you been able to return to your normal activities? Yes.   ? ?Do you have any questions about your discharge instructions: ?Diet   No. ?Medications  No. ?Follow up visit  No. ? ?Do you have questions or concerns about your Care? No. ? ?Actions: ?* If pain score is 4 or above: ?No action needed, pain <4. ? ?Have you developed a fever since your procedure? no ? ?2.   Have you had an respiratory symptoms (SOB or cough) since your procedure? no ? ?3.   Have you tested positive for COVID 19 since your procedure no ? ?4.   Have you had any family members/close contacts diagnosed with the COVID 19 since your procedure?  no ? ? ?If yes to any of these questions please route to Joylene John, RN and Joella Prince, RN ? ? ? ?

## 2021-05-07 ENCOUNTER — Encounter: Payer: Self-pay | Admitting: Gastroenterology

## 2021-06-02 ENCOUNTER — Ambulatory Visit: Payer: BLUE CROSS/BLUE SHIELD | Admitting: Dermatology

## 2021-06-03 ENCOUNTER — Encounter: Payer: Self-pay | Admitting: Dermatology

## 2021-06-03 ENCOUNTER — Ambulatory Visit: Payer: BC Managed Care – PPO | Admitting: Dermatology

## 2021-06-03 DIAGNOSIS — Z1283 Encounter for screening for malignant neoplasm of skin: Secondary | ICD-10-CM | POA: Diagnosis not present

## 2021-06-03 DIAGNOSIS — L821 Other seborrheic keratosis: Secondary | ICD-10-CM

## 2021-06-03 DIAGNOSIS — L738 Other specified follicular disorders: Secondary | ICD-10-CM | POA: Diagnosis not present

## 2021-06-03 DIAGNOSIS — D485 Neoplasm of uncertain behavior of skin: Secondary | ICD-10-CM

## 2021-06-03 DIAGNOSIS — L299 Pruritus, unspecified: Secondary | ICD-10-CM

## 2021-06-03 DIAGNOSIS — D1801 Hemangioma of skin and subcutaneous tissue: Secondary | ICD-10-CM

## 2021-06-03 DIAGNOSIS — D2362 Other benign neoplasm of skin of left upper limb, including shoulder: Secondary | ICD-10-CM

## 2021-06-03 NOTE — Patient Instructions (Addendum)
Hydrocortisone ointment (otc) ? ? ? ?Biopsy, Surgery (Curettage) & Surgery (Excision) Aftercare Instructions ? ?1. Okay to remove bandage in 24 hours ? ?2. Wash area with soap and water ? ?3. Apply Vaseline to area twice daily until healed (Not Neosporin) ? ?4. Okay to cover with a Band-Aid to decrease the chance of infection or prevent irritation from clothing; also it's okay to uncover lesion at home. ? ?5. Suture instructions: return to our office in 7-10 or 10-14 days for a nurse visit for suture removal. Variable healing with sutures, if pain or itching occurs call our office. It's okay to shower or bathe 24 hours after sutures are given. ? ?6. The following risks may occur after a biopsy, curettage or excision: bleeding, scarring, discoloration, recurrence, infection (redness, yellow drainage, pain or swelling). ? ?7. For questions, concerns and results call our office at [facility] before 4pm & Friday before 3pm. Biopsy results will be available in 1 week. ? ?

## 2021-06-23 NOTE — Progress Notes (Signed)
? ?  New Patient ?  ?Subjective  ?Jo Summers is a 51 y.o. female who presents for the following: Annual Exam (No concerns just yearly skin check ). ? ?Several areas of concern to patient plus itchy ears ?Location:  ?Duration:  ?Quality:  ?Associated Signs/Symptoms: ?Modifying Factors:  ?Severity:  ?Timing: ?Context:  ? ? ?The following portions of the chart were reviewed this encounter and updated as appropriate:  Tobacco  Allergies  Meds  Problems  Med Hx  Surg Hx  Fam Hx   ?  ? ?Objective  ?Well appearing patient in no apparent distress; mood and affect are within normal limits. ?Full body exam: No atypical pigmented spots (all checked with dermoscopy).  Possible BCC arm will be biopsied. ? ?Left Popliteal Fossa, Left Upper Arm - Anterior, Right Upper Arm - Anterior ?Several 3 to 6 mm flattopped textured light brown papules, compatible dermoscopy ? ?Head - Anterior (Face) ?Several 2 mm flesh-colored papules with eccentric dell, compatible dermoscopy ? ?Left Upper Arm - Posterior ?Pearly 4 mm pink papule with focal central erosion, rule out BCC ? ? ? ? ? ? ?Left Abdomen (side) - Upper ?Multiple 1 mm smooth red dermal papules ? 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 ? ? ? ?A full examination was performed including scalp, head, eyes, ears, nose, lips, neck, chest, axillae, abdomen, back, buttocks, bilateral upper extremities, bilateral lower extremities, hands, feet, fingers, toes, fingernails, and toenails. All findings within normal limits unless otherwise noted below.  Areas beneath undergarments not fully examined. ? ? ?Assessment & Plan  ?Encounter for screening for malignant neoplasm of skin ? ?Annual skin examination, encouraged to self examine twice annually.  Continued ultraviolet protection. ? ?Seborrheic keratosis (3) ?Left Popliteal Fossa; Left Upper Arm - Anterior; Right Upper Arm - Anterior ? ?Leave if  stable ? ?Sebaceous gland hyperplasia ?Head - Anterior (Face) ? ?Told of similar appearance of early [facility] so if any lesion grows or bleeds return for biopsy ? ?Neoplasm of uncertain behavior of skin ?Left Upper Arm - Posterior ? ?Skin / nail biopsy ?Type of biopsy: tangential   ?Informed consent: discussed and consent obtained   ?Timeout: patient name, date of birth, surgical site, and procedure verified   ?Anesthesia: the lesion was anesthetized in a standard fashion   ?Anesthetic:  1% lidocaine w/ epinephrine 1-100,000 local infiltration ?Instrument used: flexible razor blade   ?Hemostasis achieved with: aluminum chloride and electrodesiccation   ?Outcome: patient tolerated procedure well   ?Post-procedure details: wound care instructions given   ? ?Specimen 1 - Surgical pathology ?Differential Diagnosis: r/o dermatofibroma  ? ?Check Margins: No ? ?Cherry angioma ?Left Abdomen (side) - Upper ? ?No intervention indicated ? ?Itch (2) ?Left Concha; Right Cavum ? ?If hydrocortisone otc doesn't work, pt will look for something with pramoxine in it.  ? ? ?

## 2021-06-24 ENCOUNTER — Telehealth: Payer: Self-pay | Admitting: Dermatology

## 2021-06-24 NOTE — Telephone Encounter (Signed)
Path to patient DF no follow up needed and may return  ?

## 2021-06-24 NOTE — Telephone Encounter (Signed)
Results, ST
